# Patient Record
Sex: Male | Born: 1990 | Race: White | Hispanic: No | Marital: Married | Smoking: Former smoker
Health system: Southern US, Community
[De-identification: ages and names within clinical notes are randomized; demographics above are authoritative.]

## PROBLEM LIST (undated history)

## (undated) DIAGNOSIS — Z789 Other specified health status: Secondary | ICD-10-CM

## (undated) HISTORY — DX: Other specified health status: Z78.9

## (undated) HISTORY — PX: NO PAST SURGERIES: SHX2092

---

## 2019-07-18 ENCOUNTER — Other Ambulatory Visit: Payer: Self-pay

## 2019-07-18 ENCOUNTER — Encounter: Payer: Self-pay | Admitting: Emergency Medicine

## 2019-07-18 ENCOUNTER — Emergency Department
Admission: EM | Admit: 2019-07-18 | Discharge: 2019-07-18 | Disposition: A | Discharge status: Home / Self Care | Payer: PRIVATE HEALTH INSURANCE | Source: Home / Self Care

## 2019-07-18 ENCOUNTER — Emergency Department (INDEPENDENT_AMBULATORY_CARE_PROVIDER_SITE_OTHER): Payer: PRIVATE HEALTH INSURANCE

## 2019-07-18 DIAGNOSIS — M79672 Pain in left foot: Secondary | ICD-10-CM | POA: Diagnosis not present

## 2019-07-18 DIAGNOSIS — S90112A Contusion of left great toe without damage to nail, initial encounter: Secondary | ICD-10-CM

## 2019-07-18 NOTE — ED Provider Notes (Signed)
[facility]    CSN: [id number] Arrival date & time: 07/18/19  1552      History   Chief Complaint Chief Complaint  Patient presents with  . Toe Injury    HPI Gabriel Mcdaniel is a 29 y.o. male.   29 year old male with no significant past medical history, presenting today complaining of injury to his left great toe.  States around 9:00 this morning, he was moving furniture without shoes on.  States that he dropped a Ecologist on his left great toe.  Patient has used ice and elevated the toe.  Has had Tylenol and ibuprofen.  States that some of the swelling and pain is improved but he continues to have pain.  No other injuries or complaints.  The history is provided by the patient.  Toe Pain This is a new problem. The current episode started 6 to 12 hours ago. The problem occurs constantly. The problem has not changed since onset.Pertinent negatives include no chest pain, no abdominal pain, no headaches and no shortness of breath. Nothing aggravates the symptoms. The symptoms are relieved by ice and acetaminophen. He has tried acetaminophen for the symptoms. The treatment provided mild relief.    History reviewed. No pertinent past medical history.  There are no problems to display for this patient.   History reviewed. No pertinent surgical history.     Home Medications    Prior to Admission medications   Not on File    Family History No family history on file.  Social History Social History   Tobacco Use  . Smoking status: Former Research scientist (life sciences)  . Smokeless tobacco: Never Used  Substance Use Topics  . Alcohol use: Not on file  . Drug use: Not on file     Allergies   Patient has no known allergies.   Review of Systems Review of Systems  Constitutional: Negative for chills and fever.  HENT: Negative for ear pain and sore throat.   Eyes: Negative for pain and visual disturbance.  Respiratory: Negative for cough and shortness of breath.   Cardiovascular:  Negative for chest pain and palpitations.  Gastrointestinal: Negative for abdominal pain and vomiting.  Genitourinary: Negative for dysuria and hematuria.  Musculoskeletal: Positive for arthralgias. Negative for back pain.  Skin: Negative for color change and rash.  Neurological: Negative for seizures, syncope and headaches.  All other systems reviewed and are negative.    Physical Exam Triage Vital Signs ED Triage Vitals  Enc Vitals Group     BP      Pulse      Resp      Temp      Temp src      SpO2      Weight      Height      Head Circumference      Peak Flow      Pain Score      Pain Loc      Pain Edu?      Excl. in [place]?    No data found.  Updated Vital Signs BP (!) 131/91 (BP Location: Right Arm)   Pulse (!) 109   Temp 98.8 F (37.1 C) (Oral)   Resp 18   Ht 6\' 2"  (1.88 m)   Wt 185 lb (83.9 kg)   SpO2 98%   BMI 23.75 kg/m   Visual Acuity Right Eye Distance:   Left Eye Distance:   Bilateral Distance:    Right Eye Near:   Left Eye  Near:    Bilateral Near:     Physical Exam Vitals and nursing note reviewed.  Constitutional:      Appearance: He is well-developed.  HENT:     Head: Normocephalic and atraumatic.  Eyes:     Conjunctiva/sclera: Conjunctivae normal.  Cardiovascular:     Rate and Rhythm: Normal rate and regular rhythm.     Heart sounds: No murmur.  Pulmonary:     Effort: Pulmonary effort is normal. No respiratory distress.     Breath sounds: Normal breath sounds.  Abdominal:     Palpations: Abdomen is soft.     Tenderness: There is no abdominal tenderness.  Musculoskeletal:     Cervical back: Neck supple.     Left foot: Decreased range of motion. Swelling and tenderness present.       Legs:     Comments: Subungual hematoma noted to left great toe.  Tenderness and edema noted to the IP joint of the left great toe.  No tenderness at the MTP joint.  No tenderness of the foot otherwise.  Good pedal pulse.  Skin:    General: Skin is warm  and dry.  Neurological:     Mental Status: He is alert.      UC Treatments / Results  Labs (all labs ordered are listed, but only abnormal results are displayed) Labs Reviewed - No data to display  EKG   Radiology DG Foot Complete Left  Result Date: 07/18/2019 CLINICAL DATA:  Dropped piece of furniture on foot EXAM: LEFT FOOT - COMPLETE 3+ VIEW COMPARISON:  None. FINDINGS: Frontal, oblique, and lateral views were obtained. No fracture or dislocation. Joint spaces appear normal. No erosive change. No radiopaque foreign body. IMPRESSION: No fracture or dislocation.  No evident arthropathy. Electronically Signed   By: Bretta Bang III M.D.   On: 07/18/2019 16:36    Procedures Procedures (including critical care time)  Medications Ordered in UC Medications - No data to display  Initial Impression / Assessment and Plan / UC Course  I have reviewed the triage vital signs and the nursing notes.  Pertinent labs & imaging results that were available during my care of the patient were reviewed by me and considered in my medical decision making (see chart for details).     Imaging without evidence of acute findings, specifically no evidence of fracture.  Recommended rest, ice and elevation.  Anti-inflammatories. Final Clinical Impressions(s) / UC Diagnoses   Final diagnoses:  Contusion of left great toe without damage to nail, initial encounter   Discharge Instructions   None    ED Prescriptions    None     PDMP not reviewed this encounter.   Alecia Lemming, [place] 07/18/19 1644

## 2019-07-18 NOTE — ED Triage Notes (Signed)
Patient dropped furniture on left large toe at 0900; has elevated and iced but pain not subsiding. Took ibuprofen 600mg  at 1500. Has not had influenza vacc this season. No known exposure to covid positive person.

## 2019-07-27 ENCOUNTER — Telehealth: Payer: PRIVATE HEALTH INSURANCE

## 2019-07-27 ENCOUNTER — Emergency Department
Admission: EM | Admit: 2019-07-27 | Discharge: 2019-07-27 | Disposition: A | Discharge status: Home / Self Care | Payer: PRIVATE HEALTH INSURANCE | Source: Home / Self Care

## 2019-07-27 ENCOUNTER — Other Ambulatory Visit: Payer: Self-pay

## 2019-07-27 DIAGNOSIS — S90222A Contusion of left lesser toe(s) with damage to nail, initial encounter: Secondary | ICD-10-CM | POA: Diagnosis not present

## 2019-07-27 NOTE — ED Triage Notes (Signed)
Pt here today for recheck of LT great toe. Was seen in UC 1/19 after dropping furniture on it. Wants it rechecked to make sure its on track. Says the day after visit, he did lance it himself. And drained some blood from it.

## 2019-07-27 NOTE — ED Provider Notes (Signed)
[name]    CSN: [id number] Arrival date & time: 07/27/19  1633      History   Chief Complaint Chief Complaint  Patient presents with  . Follow-up    LT great toe    HPI Gabriel Mcdaniel is a 29 y.o. male.   The history is provided by the patient. No language interpreter was used.  Toe Pain This is a new problem. Episode onset: 10 days ago. The problem occurs constantly. Nothing aggravates the symptoms. Nothing relieves the symptoms. He has tried nothing for the symptoms. The treatment provided no relief.  Pt smashed his toe nail on1/19.  Pt drained blood from under his nail x 2.    History reviewed. No pertinent past medical history.  There are no problems to display for this patient.   History reviewed. No pertinent surgical history.     Home Medications    Prior to Admission medications   Not on File    Family History History reviewed. No pertinent family history.  Social History Social History   Tobacco Use  . Smoking status: Former Research scientist (life sciences)  . Smokeless tobacco: Never Used  Substance Use Topics  . Alcohol use: Not on file  . Drug use: Not on file     Allergies   Penicillins   Review of Systems Review of Systems  All other systems reviewed and are negative.    Physical Exam Triage Vital Signs ED Triage Vitals  Enc Vitals Group     BP 07/27/19 1644 133/83     Pulse Rate 07/27/19 1644 75     Resp 07/27/19 1644 18     Temp 07/27/19 1644 97.8 F (36.6 C)     Temp Source 07/27/19 1644 Oral     SpO2 07/27/19 1644 98 %     Weight 07/27/19 1645 185 lb (83.9 kg)     Height 07/27/19 1645 6\' 2"  (1.88 m)     Head Circumference --      Peak Flow --      Pain Score 07/27/19 1644 0     Pain Loc --      Pain Edu? --      Excl. in [place]? --    No data found.  Updated Vital Signs BP 133/83 (BP Location: Right Arm)   Pulse 75   Temp 97.8 F (36.6 C) (Oral)   Resp 18   Ht 6\' 2"  (1.88 m)   Wt 83.9 kg   SpO2 98%   BMI 23.75 kg/m    Visual Acuity Right Eye Distance:   Left Eye Distance:   Bilateral Distance:    Right Eye Near:   Left Eye Near:    Bilateral Near:     Physical Exam Vitals reviewed.  Musculoskeletal:        General: Swelling present.     Comments: Bruised proximal nail  Good color to distal tip ns intact  Skin:    General: Skin is warm.  Neurological:     General: No focal deficit present.     Mental Status: He is alert.  Psychiatric:        Mood and Affect: Mood normal.      UC Treatments / Results  Labs (all labs ordered are listed, but only abnormal results are displayed) Labs Reviewed - No data to display  EKG   Radiology No results found.  Procedures Procedures (including critical care time)  Medications Ordered in UC Medications - No data to display  Initial Impression / Assessment and Plan / UC Course  I have reviewed the triage vital signs and the nursing notes.  Pertinent labs & imaging results that were available during my care of the patient were reviewed by me and considered in my medical decision making (see chart for details).     Mdm: Pt advised nail should push off.  Leave as a natural bandage.  Final Clinical Impressions(s) / UC Diagnoses   Final diagnoses:  Contusion of toenail of left foot, initial encounter     Discharge Instructions     Return if any problems.    ED Prescriptions    None     PDMP not reviewed this encounter.  An After Visit Summary was printed and given to the patient.    Elson Areas, [place] 07/27/19 1725

## 2019-07-27 NOTE — Discharge Instructions (Signed)
Return if any problems.

## 2020-08-12 IMAGING — DX DG FOOT COMPLETE 3+V*L*
3 series · 3 of 3 positions shown · non-contrast
Comparison: None.

CLINICAL DATA: Dropped piece of furniture on foot

EXAM:
LEFT FOOT - COMPLETE 3+ VIEW

[foot ap]
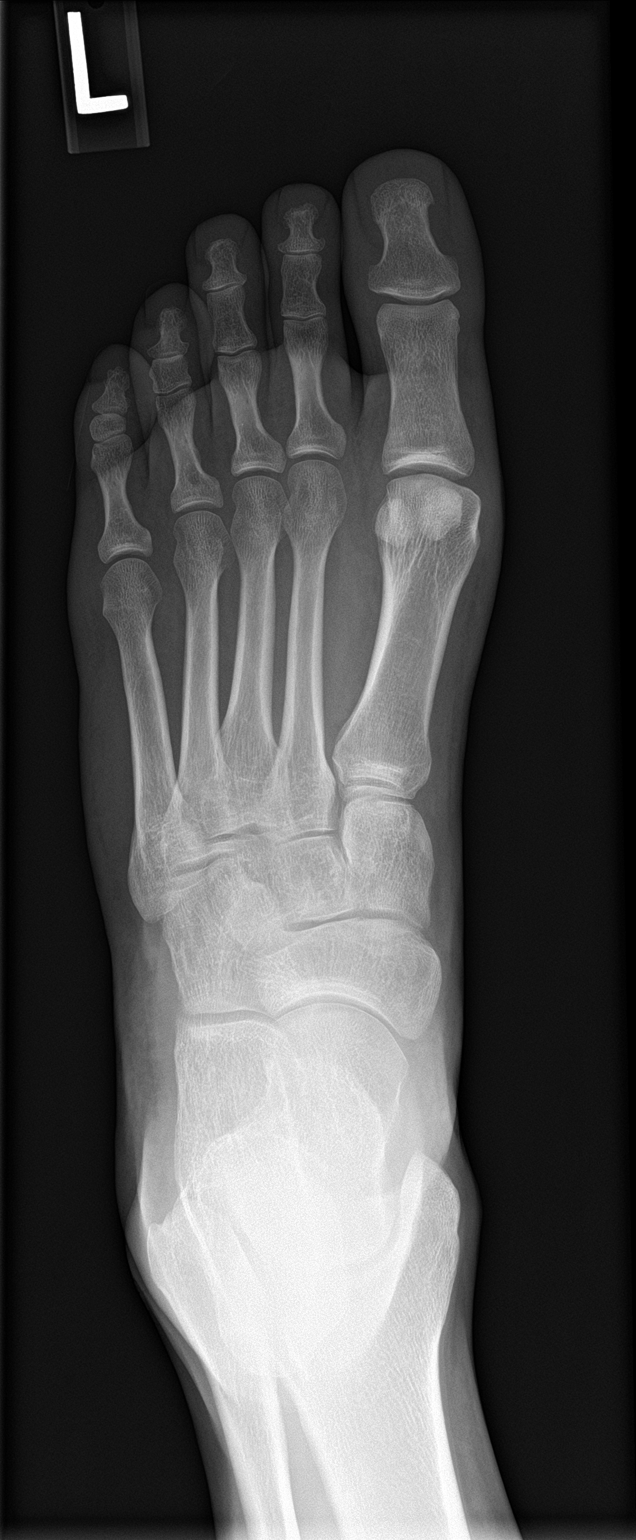

[foot obl]
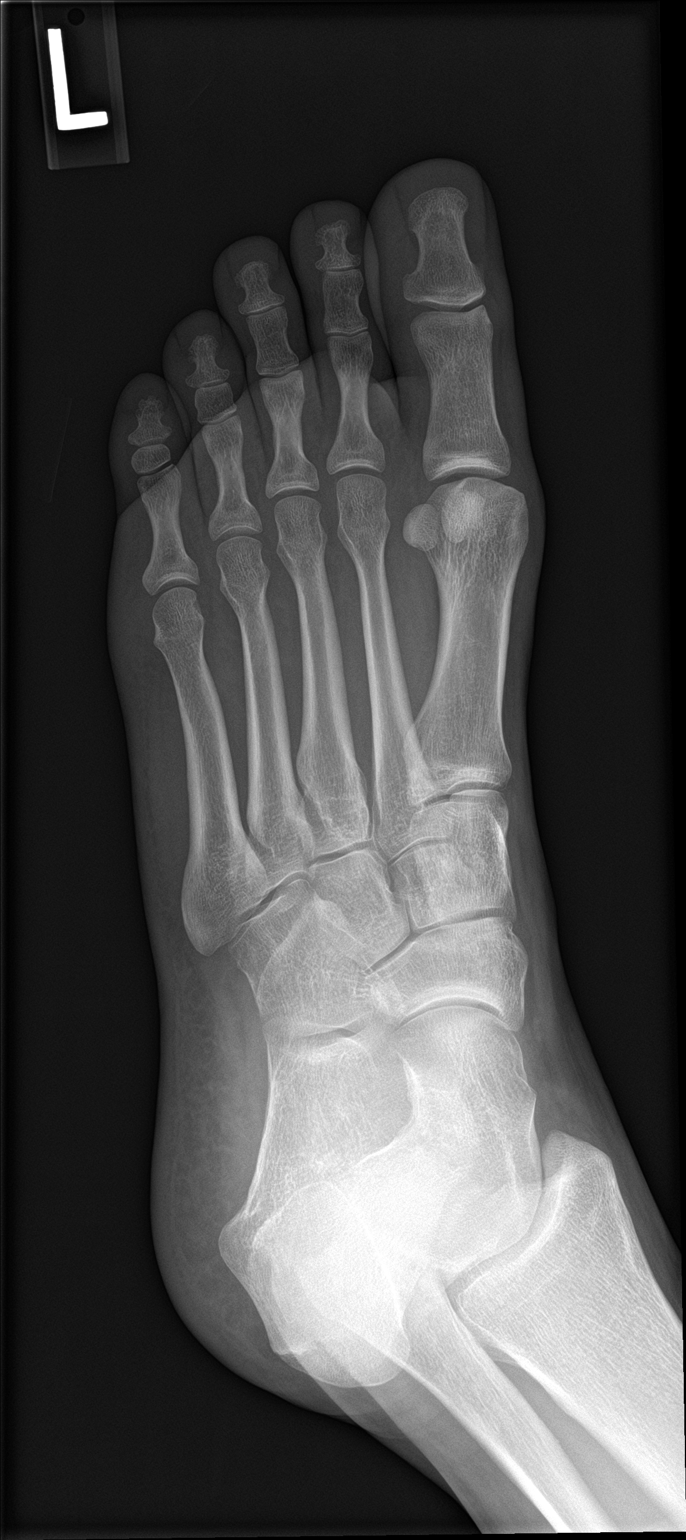

[foot lat]
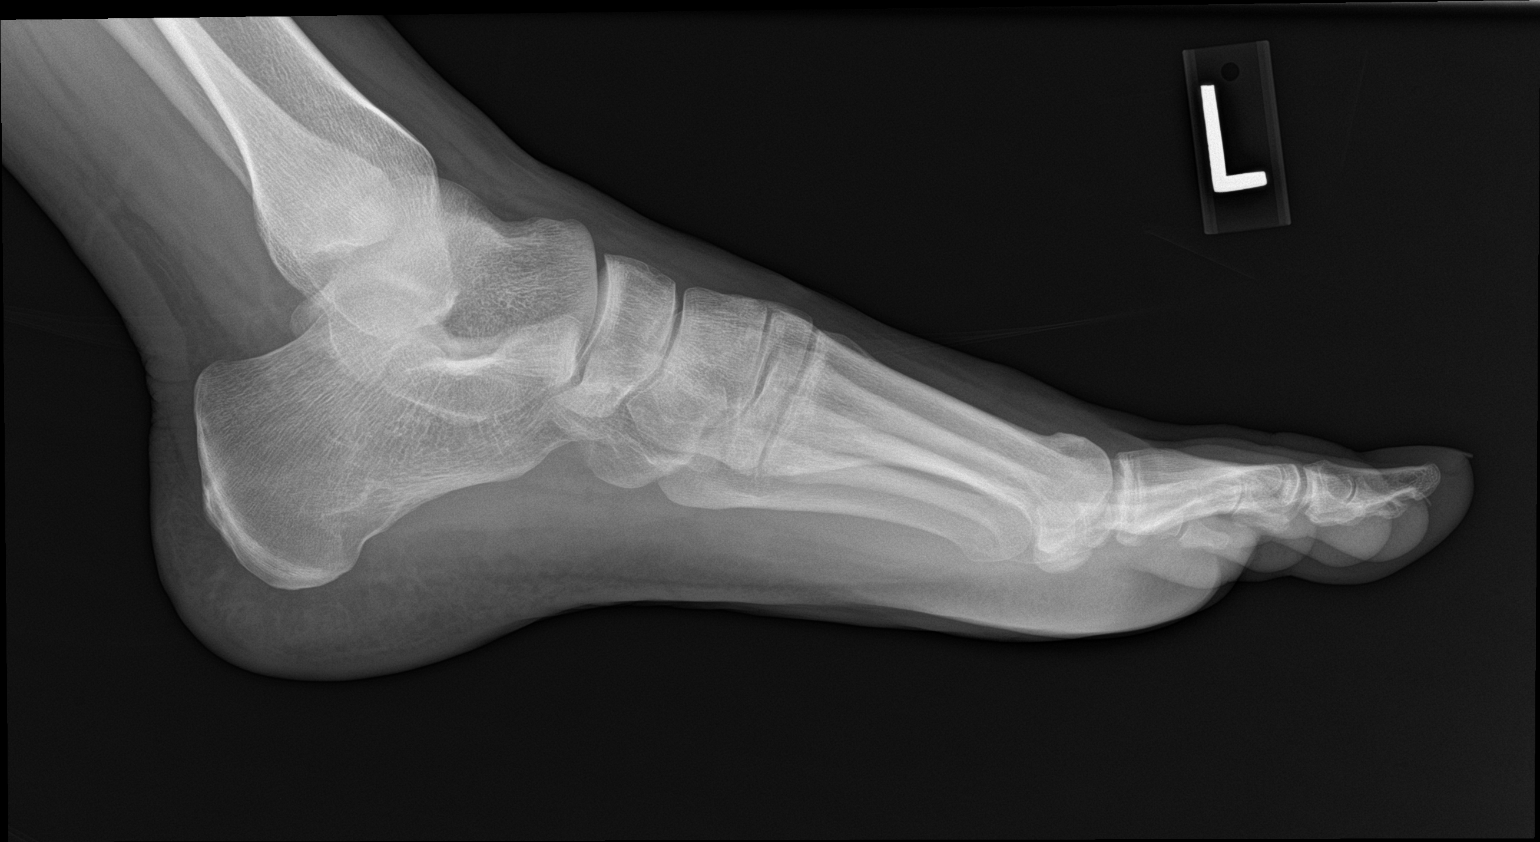

[3 of 3 positions shown; findings below may reference images not displayed]

FINDINGS: Frontal, oblique, and lateral views were obtained. No fracture or
dislocation. Joint spaces appear normal. No erosive change. No
radiopaque foreign body.
IMPRESSION: No fracture or dislocation.  No evident arthropathy.

## 2021-01-10 ENCOUNTER — Encounter: Payer: Self-pay | Admitting: Medical-Surgical

## 2021-01-10 ENCOUNTER — Ambulatory Visit (INDEPENDENT_AMBULATORY_CARE_PROVIDER_SITE_OTHER): Payer: Commercial Managed Care - PPO | Admitting: Medical-Surgical

## 2021-01-10 ENCOUNTER — Other Ambulatory Visit: Payer: Self-pay

## 2021-01-10 VITALS — BP 123/81 | HR 81 | Ht 74.25 in | Wt 188.8 lb

## 2021-01-10 DIAGNOSIS — R41840 Attention and concentration deficit: Secondary | ICD-10-CM | POA: Diagnosis not present

## 2021-01-10 DIAGNOSIS — F419 Anxiety disorder, unspecified: Secondary | ICD-10-CM

## 2021-01-10 DIAGNOSIS — F329 Major depressive disorder, single episode, unspecified: Secondary | ICD-10-CM

## 2021-01-10 MED ORDER — ESCITALOPRAM OXALATE 10 MG PO TABS: ORAL_TABLET | ORAL | 1 refills | Status: DC

## 2021-01-10 NOTE — Progress Notes (Signed)
New Patient Office Visit  Subjective:  Patient ID: Gabriel Mcdaniel, male    DOB: Nov 07, 1990  Age: 30 y.o. MRN: [id number]  CC: No chief complaint on file.   HPI Gabriel Mcdaniel presents to establish care.  Mood/inattention-does note that he has had some issues recently with mood and inattention issues.  Has difficulty with irritability, especially when premade plans go awry.  Reports he is a very physical person and likes to have frequent intimate sessions with his wife but she is on the opposite end of the spectrum and this is causing some relationship issues.  He has been been seeing counseling and the counselor has recommended he be sent to psychiatry or psychology for further testing.  Notes that he does get fixated on the idea of something but once he has the time to do or obtain that thing, he is no longer interested.  Spends a lot of time during his workday distracted, thinking about all of the things that he wants to do when he is not working.  On his days off, he has no motivation to complete any of those activities.  He has never taken medications for mood or inattention but is interested in options.  Denies SI/HI.  No past medical history on file.  No past surgical history on file.  Family History  Problem Relation Age of Onset   Diabetes Father        Type 2   Hypertension Father     Social History   Socioeconomic History   Marital status: Single    Spouse name: Tresa Endo   Number of children: Not on file   Years of education: Not on file   Highest education level: Not on file  Occupational History   Not on file  Tobacco Use   Smoking status: Former   Smokeless tobacco: Never  Vaping Use   Vaping Use: Former  Substance and Sexual Activity   Alcohol use: Not on file   Drug use: Never   Sexual activity: Yes    Birth control/protection: Pill  Other Topics Concern   Not on file  Social History Narrative   Not on file   Social Determinants of Health   Financial  Resource Strain: Not on file  Food Insecurity: Not on file  Transportation Needs: Not on file  Physical Activity: Not on file  Stress: Not on file  Social Connections: Not on file  Intimate Partner Violence: Not on file    ROS Review of Systems  Constitutional:  Negative for chills, fatigue, fever and unexpected weight change.  Respiratory:  Negative for cough, chest tightness, shortness of breath and wheezing.   Cardiovascular:  Negative for chest pain, palpitations and leg swelling.  Neurological:  Negative for dizziness, light-headedness and headaches.  Psychiatric/Behavioral:  Positive for decreased concentration and dysphoric mood. Negative for self-injury, sleep disturbance and suicidal ideas. The patient is nervous/anxious.    Objective:   Today's Vitals: BP 123/81   Pulse 81   Ht 6' 2.25" (1.886 m)   Wt 188 lb 12.8 oz (85.6 kg)   SpO2 97%   BMI 24.08 kg/m   Physical Exam Vitals and nursing note reviewed.  Constitutional:      General: He is not in acute distress.    Appearance: Normal appearance.  HENT:     Head: Normocephalic and atraumatic.  Cardiovascular:     Rate and Rhythm: Normal rate and regular rhythm.     Pulses: Normal pulses.  Heart sounds: Normal heart sounds. No murmur heard.   No friction rub. No gallop.  Pulmonary:     Effort: Pulmonary effort is normal. No respiratory distress.     Breath sounds: Normal breath sounds.  Skin:    General: Skin is warm and dry.  Neurological:     Mental Status: He is alert and oriented to person, place, and time.  Psychiatric:        Mood and Affect: Mood normal.        Behavior: Behavior normal.        Thought Content: Thought content normal.        Judgment: Judgment normal.    Assessment & Plan:   1. Anxiety 2. Reactive depression 3. Inattention Referring to psychology for further testing.  Discussed various treatment options for his symptoms.  Because his symptoms are so disruptive, he would like  to go ahead and start a medication at this time.  Starting Lexapro 5 mg daily x8 days and increase to 10 mg daily.  Continue counseling as instructed. - Ambulatory referral to Psychology  Outpatient Encounter Medications as of 01/10/2021  Medication Sig   Calcium Polycarbophil (FIBER-CAPS PO) Take 1 each by mouth daily.   escitalopram (LEXAPRO) 10 MG tablet Take 1/2 tablet (5mg ) daily for 8 days then increase to 1 full tablet (10mg ) daily.   No facility-administered encounter medications on file as of 01/10/2021.    Follow-up: Return in about 4 weeks (around 02/07/2021) for mood follow up (virtual is fine).   01/12/2021, DNP, APRN, FNP-BC [facility] and Sports Medicine

## 2021-01-13 ENCOUNTER — Encounter: Payer: Self-pay | Admitting: Medical-Surgical

## 2021-01-15 ENCOUNTER — Encounter: Payer: Self-pay | Admitting: Medical-Surgical

## 2021-01-16 ENCOUNTER — Encounter: Payer: Self-pay | Admitting: Medical-Surgical

## 2021-01-16 NOTE — Telephone Encounter (Signed)
Referral for psychology for ADHD testing sent to [facility] but they are saying they don't do testing at that location. Can you please look into this and if necessary, redirect his referral?  Thanks, Ander Slade

## 2021-01-21 NOTE — Telephone Encounter (Signed)
Testing was for complete neuropsych eval to check for ADHD and possible OCD related to his thoughts/behaviors that border on obsessive. No need for counseling at this time.

## 2021-02-07 ENCOUNTER — Telehealth (INDEPENDENT_AMBULATORY_CARE_PROVIDER_SITE_OTHER): Payer: Commercial Managed Care - PPO | Admitting: Medical-Surgical

## 2021-02-07 ENCOUNTER — Encounter: Payer: Self-pay | Admitting: Medical-Surgical

## 2021-02-07 DIAGNOSIS — F329 Major depressive disorder, single episode, unspecified: Secondary | ICD-10-CM | POA: Diagnosis not present

## 2021-02-07 DIAGNOSIS — R41840 Attention and concentration deficit: Secondary | ICD-10-CM | POA: Diagnosis not present

## 2021-02-07 DIAGNOSIS — F419 Anxiety disorder, unspecified: Secondary | ICD-10-CM | POA: Diagnosis not present

## 2021-02-07 MED ORDER — ESCITALOPRAM OXALATE 10 MG PO TABS: 10.0000 mg | ORAL_TABLET | Freq: Every day | ORAL | 1 refills | Status: AC

## 2021-02-07 MED ORDER — AMPHETAMINE-DEXTROAMPHET ER 10 MG PO CP24: 10.0000 mg | ORAL_CAPSULE | Freq: Every day | ORAL | 0 refills | Status: AC

## 2021-02-07 MED ORDER — HYDROXYZINE PAMOATE 25 MG PO CAPS: 25.0000 mg | ORAL_CAPSULE | Freq: Every evening | ORAL | 0 refills | Status: AC | PRN

## 2021-02-07 NOTE — Progress Notes (Signed)
Virtual Visit via Video Note  I connected with Gabriel Mcdaniel on 02/07/21 at  3:00 PM EDT by a video enabled telemedicine application and verified that I am speaking with the correct person using two identifiers.   I discussed the limitations of evaluation and management by telemedicine and the availability of in person appointments. The patient expressed understanding and agreed to proceed.  Patient location: home Provider locations: office  Subjective:    CC: Mood follow-up  HPI: Pleasant 30 year old male presenting via MyChart video visit for mood follow-up.  He has been doing fairly well since his last visit with me.  He started Lexapro 5 mg daily for 1 week then increase to 10 mg daily as instructed.  He tolerates the medication well with no significant side effects.  Notes that he has not noticed a big change in his symptoms but he has not gotten any worse.  Most of his symptoms do continue to revolve around inattention issues and being unable to focus.  [facility] by [facility] but after filling out his paperwork, he was told that they do not do ADHD testing there.  His referral was to be sent somewhere else but I do not see that record in the chart.  On completion of his adult ADHD self-report scale system checklist, he does have quite a number of abnormal responses that indicate the likelihood of ADHD.  Past medical history, Surgical history, Family history not pertinant except as noted below, Social history, Allergies, and medications have been entered into the medical record, reviewed, and corrections made.   Review of Systems: See HPI for pertinent positives and negatives.   Depression screen [facility] 2/9 02/07/2021 01/13/2021 01/10/2021  Decreased Interest 1 1 1   Down, Depressed, Hopeless 1 1 1   PHQ - 2 Score 2 2 2   Altered sleeping 1 0 0  Tired, decreased energy 0 0 0  Change in appetite 0 0 0  Feeling bad or failure about yourself  2 2 2   Trouble  concentrating 2 2 2   Moving slowly or fidgety/restless 1 0 0  Suicidal thoughts 0 0 0  PHQ-9 Score 8 6 6   Difficult doing work/chores Somewhat difficult Somewhat difficult Somewhat difficult   GAD 7 : Generalized Anxiety Score 02/07/2021 01/13/2021 01/10/2021  Nervous, Anxious, on Edge 1 1 1   Control/stop worrying 1 1 1   Worry too much - different things 1 1 1   Trouble relaxing 1 1 1   Restless 1 0 0  Easily annoyed or irritable 0 2 2  Afraid - awful might happen 0 0 0  Total GAD 7 Score 5 6 6   Anxiety Difficulty Somewhat difficult Somewhat difficult Somewhat difficult     Objective:    General: Speaking clearly in complete sentences without any shortness of breath.  Alert and oriented x3.  Normal judgment. No apparent acute distress.  Impression and Recommendations:    1. Anxiety 2. Reactive depression He has seen a few changes on Lexapro but nothing profound so far.  He would like to continue the medication at 10 mg daily for the full 6 weeks before trying to increase it.  3.  Inattention With his significantly positive self-report scale symptom checklist, agreed to do a trial of Adderall Exar 10 mg daily to see if this is somewhat beneficial for his symptoms.  I would still like him to get fully tested so reentering a referral to psychology for ADHD testing.  I discussed the assessment  and treatment plan with the patient. The patient was provided an opportunity to ask questions and all were answered. The patient agreed with the plan and demonstrated an understanding of the instructions.   The patient was advised to call back or seek an in-person evaluation if the symptoms worsen or if the condition fails to improve as anticipated.  20 minutes of non-face-to-face time was provided during this encounter.  Return in about 4 weeks (around 03/07/2021) for Mood/ADHD follow-up.  Thayer Ohm, DNP, APRN, FNP-BC [facility] and Sports Medicine

## 2021-03-21 ENCOUNTER — Encounter: Payer: Self-pay | Admitting: Medical-Surgical
